# Patient Record
Sex: Female | Born: 1945 | Race: White | Hispanic: No
Health system: Southern US, Community
[De-identification: ages and names within clinical notes are randomized; demographics above are authoritative.]

---

## 2009-12-16 ENCOUNTER — Inpatient Hospital Stay: Payer: Self-pay | Admitting: Internal Medicine

## 2010-02-21 ENCOUNTER — Emergency Department: Payer: Self-pay | Admitting: Emergency Medicine

## 2010-04-22 ENCOUNTER — Ambulatory Visit: Payer: Self-pay

## 2010-09-21 ENCOUNTER — Inpatient Hospital Stay: Payer: Self-pay | Admitting: Internal Medicine

## 2010-11-26 ENCOUNTER — Emergency Department: Payer: Self-pay | Admitting: Unknown Physician Specialty

## 2014-01-09 ENCOUNTER — Observation Stay: Payer: Self-pay | Admitting: Internal Medicine

## 2014-01-09 LAB — COMPREHENSIVE METABOLIC PANEL
AST: 20 U/L (ref 15–37)
Albumin: 3.8 g/dL (ref 3.4–5.0)
Alkaline Phosphatase: 117 U/L
Anion Gap: 2 — ABNORMAL LOW (ref 7–16)
BUN: 7 mg/dL (ref 7–18)
Bilirubin,Total: 0.5 mg/dL (ref 0.2–1.0)
CALCIUM: 8.6 mg/dL (ref 8.5–10.1)
CHLORIDE: 106 mmol/L (ref 98–107)
Co2: 33 mmol/L — ABNORMAL HIGH (ref 21–32)
Creatinine: 0.87 mg/dL (ref 0.60–1.30)
Glucose: 94 mg/dL (ref 65–99)
OSMOLALITY: 279 (ref 275–301)
Potassium: 3.1 mmol/L — ABNORMAL LOW (ref 3.5–5.1)
SGPT (ALT): 18 U/L (ref 12–78)
Sodium: 141 mmol/L (ref 136–145)
Total Protein: 7.4 g/dL (ref 6.4–8.2)

## 2014-01-09 LAB — TROPONIN I
TROPONIN-I: 0.08 ng/mL — AB
Troponin-I: 0.09 ng/mL — ABNORMAL HIGH
Troponin-I: 0.08 ng/mL — ABNORMAL HIGH

## 2014-01-09 LAB — APTT: Activated PTT: 30.7 secs (ref 23.6–35.9)

## 2014-01-09 LAB — URINALYSIS, COMPLETE
BILIRUBIN, UR: NEGATIVE
Blood: NEGATIVE
GLUCOSE, UR: NEGATIVE mg/dL (ref 0–75)
Ketone: NEGATIVE
LEUKOCYTE ESTERASE: NEGATIVE
Nitrite: NEGATIVE
PH: 7 (ref 4.5–8.0)
Protein: NEGATIVE
RBC,UR: 1 /HPF (ref 0–5)
Specific Gravity: 1.005 (ref 1.003–1.030)
Squamous Epithelial: 1

## 2014-01-09 LAB — CBC
HCT: 39.6 % (ref 35.0–47.0)
HGB: 12.9 g/dL (ref 12.0–16.0)
MCH: 27.9 pg (ref 26.0–34.0)
MCHC: 32.6 g/dL (ref 32.0–36.0)
MCV: 86 fL (ref 80–100)
Platelet: 423 10*3/uL (ref 150–440)
RBC: 4.63 10*6/uL (ref 3.80–5.20)
RDW: 14.4 % (ref 11.5–14.5)
WBC: 11.8 10*3/uL — ABNORMAL HIGH (ref 3.6–11.0)

## 2014-01-09 LAB — LIPASE, BLOOD: LIPASE: 85 U/L (ref 73–393)

## 2014-01-09 LAB — PROTIME-INR
INR: 1
Prothrombin Time: 13.1 secs (ref 11.5–14.7)

## 2014-01-10 LAB — MAGNESIUM: Magnesium: 2.1 mg/dL

## 2014-01-10 LAB — POTASSIUM: Potassium: 3.8 mmol/L (ref 3.5–5.1)

## 2014-01-11 LAB — BASIC METABOLIC PANEL
Anion Gap: 7 (ref 7–16)
BUN: 13 mg/dL (ref 7–18)
CHLORIDE: 107 mmol/L (ref 98–107)
CREATININE: 1.08 mg/dL (ref 0.60–1.30)
Calcium, Total: 8.9 mg/dL (ref 8.5–10.1)
Co2: 29 mmol/L (ref 21–32)
EGFR (African American): >60
GFR CALC NON AF AMER: 54 — AB
GLUCOSE: 199 mg/dL — AB (ref 65–99)
OSMOLALITY: 291 (ref 275–301)
Potassium: 3.8 mmol/L (ref 3.5–5.1)
Sodium: 143 mmol/L (ref 136–145)

## 2014-01-11 LAB — HEMOGLOBIN: HGB: 12.5 g/dL (ref 12.0–16.0)

## 2014-01-15 LAB — PATHOLOGY REPORT

## 2014-01-18 ENCOUNTER — Emergency Department: Payer: Self-pay | Admitting: Internal Medicine

## 2014-01-18 LAB — URINALYSIS, COMPLETE
BACTERIA: NONE SEEN
Bilirubin,UR: NEGATIVE
Blood: NEGATIVE
Glucose,UR: NEGATIVE mg/dL (ref 0–75)
Ketone: NEGATIVE
Nitrite: NEGATIVE
Ph: 8 (ref 4.5–8.0)
Protein: NEGATIVE
Specific Gravity: 1.017 (ref 1.003–1.030)
WBC UR: 3 /HPF (ref 0–5)

## 2014-01-18 LAB — COMPREHENSIVE METABOLIC PANEL
ALBUMIN: 3.6 g/dL (ref 3.4–5.0)
Alkaline Phosphatase: 108 U/L
Anion Gap: 5 — ABNORMAL LOW (ref 7–16)
BILIRUBIN TOTAL: 0.6 mg/dL (ref 0.2–1.0)
BUN: 12 mg/dL (ref 7–18)
CHLORIDE: 106 mmol/L (ref 98–107)
Calcium, Total: 8.7 mg/dL (ref 8.5–10.1)
Co2: 30 mmol/L (ref 21–32)
Creatinine: 0.88 mg/dL (ref 0.60–1.30)
EGFR (African American): >60
EGFR (Non-African Amer.): >60
Glucose: 91 mg/dL (ref 65–99)
OSMOLALITY: 281 (ref 275–301)
POTASSIUM: 3.3 mmol/L — AB (ref 3.5–5.1)
SGOT(AST): 17 U/L (ref 15–37)
SGPT (ALT): 16 U/L (ref 12–78)
SODIUM: 141 mmol/L (ref 136–145)
TOTAL PROTEIN: 6.9 g/dL (ref 6.4–8.2)

## 2014-01-18 LAB — CBC
HCT: 40.1 % (ref 35.0–47.0)
HGB: 13.1 g/dL (ref 12.0–16.0)
MCH: 27.8 pg (ref 26.0–34.0)
MCHC: 32.6 g/dL (ref 32.0–36.0)
MCV: 86 fL (ref 80–100)
Platelet: 362 10*3/uL (ref 150–440)
RBC: 4.7 10*6/uL (ref 3.80–5.20)
RDW: 14.4 % (ref 11.5–14.5)
WBC: 9.7 10*3/uL (ref 3.6–11.0)

## 2014-01-18 LAB — TROPONIN I
TROPONIN-I: 0.08 ng/mL — AB
Troponin-I: 0.07 ng/mL — ABNORMAL HIGH

## 2014-03-01 ENCOUNTER — Emergency Department: Payer: Self-pay | Admitting: Emergency Medicine

## 2014-04-29 ENCOUNTER — Inpatient Hospital Stay: Payer: Self-pay | Admitting: Internal Medicine

## 2014-04-29 LAB — URINALYSIS, COMPLETE
BACTERIA: NONE SEEN
BILIRUBIN, UR: NEGATIVE
Blood: NEGATIVE
Glucose,UR: NEGATIVE mg/dL (ref 0–75)
Ketone: NEGATIVE
Nitrite: NEGATIVE
PROTEIN: NEGATIVE
Ph: 6 (ref 4.5–8.0)
Specific Gravity: 1.008 (ref 1.003–1.030)
Squamous Epithelial: 10
WBC UR: 14 /HPF (ref 0–5)

## 2014-04-29 LAB — COMPREHENSIVE METABOLIC PANEL
ALBUMIN: 3.5 g/dL (ref 3.4–5.0)
ALK PHOS: 114 U/L
AST: 15 U/L (ref 15–37)
Anion Gap: 8 (ref 7–16)
BUN: 5 mg/dL — ABNORMAL LOW (ref 7–18)
Bilirubin,Total: 0.4 mg/dL (ref 0.2–1.0)
CALCIUM: 8.7 mg/dL (ref 8.5–10.1)
CHLORIDE: 107 mmol/L (ref 98–107)
CREATININE: 0.73 mg/dL (ref 0.60–1.30)
Co2: 29 mmol/L (ref 21–32)
EGFR (African American): >60
EGFR (Non-African Amer.): >60
Glucose: 89 mg/dL (ref 65–99)
Osmolality: 284 (ref 275–301)
Potassium: 3.5 mmol/L (ref 3.5–5.1)
SGPT (ALT): 17 U/L
Sodium: 144 mmol/L (ref 136–145)
Total Protein: 6.9 g/dL (ref 6.4–8.2)

## 2014-04-29 LAB — CBC WITH DIFFERENTIAL/PLATELET
Basophil #: 0.1 10*3/uL (ref 0.0–0.1)
Basophil %: 1.1 %
EOS PCT: 1.7 %
Eosinophil #: 0.2 10*3/uL (ref 0.0–0.7)
HCT: 36.4 % (ref 35.0–47.0)
HGB: 11.9 g/dL — ABNORMAL LOW (ref 12.0–16.0)
Lymphocyte #: 2.9 10*3/uL (ref 1.0–3.6)
Lymphocyte %: 29.7 %
MCH: 26.8 pg (ref 26.0–34.0)
MCHC: 32.8 g/dL (ref 32.0–36.0)
MCV: 82 fL (ref 80–100)
MONOS PCT: 7.9 %
Monocyte #: 0.8 x10 3/mm (ref 0.2–0.9)
NEUTROS PCT: 59.6 %
Neutrophil #: 5.9 10*3/uL (ref 1.4–6.5)
Platelet: 477 10*3/uL — ABNORMAL HIGH (ref 150–440)
RBC: 4.45 10*6/uL (ref 3.80–5.20)
RDW: 15.5 % — AB (ref 11.5–14.5)
WBC: 9.9 10*3/uL (ref 3.6–11.0)

## 2014-04-29 LAB — CK TOTAL AND CKMB (NOT AT ARMC)
CK, TOTAL: 168 U/L
CK-MB: 2.3 ng/mL (ref 0.5–3.6)

## 2014-04-29 LAB — TROPONIN I: Troponin-I: 0.23 ng/mL — ABNORMAL HIGH

## 2014-04-29 LAB — APTT: Activated PTT: 29.9 secs (ref 23.6–35.9)

## 2014-04-29 LAB — HEPARIN LEVEL (UNFRACTIONATED)

## 2014-04-29 LAB — PROTIME-INR
INR: 1.1
Prothrombin Time: 13.6 secs (ref 11.5–14.7)

## 2014-04-30 LAB — CBC WITH DIFFERENTIAL/PLATELET
Basophil #: 0.1 10*3/uL (ref 0.0–0.1)
Basophil %: 1.2 %
EOS ABS: 0.2 10*3/uL (ref 0.0–0.7)
EOS PCT: 2.5 %
HCT: 33 % — ABNORMAL LOW (ref 35.0–47.0)
HGB: 10.7 g/dL — AB (ref 12.0–16.0)
Lymphocyte #: 3.3 10*3/uL (ref 1.0–3.6)
Lymphocyte %: 34.8 %
MCH: 26.6 pg (ref 26.0–34.0)
MCHC: 32.5 g/dL (ref 32.0–36.0)
MCV: 82 fL (ref 80–100)
MONO ABS: 0.8 x10 3/mm (ref 0.2–0.9)
MONOS PCT: 8.6 %
NEUTROS PCT: 52.9 %
Neutrophil #: 5 10*3/uL (ref 1.4–6.5)
Platelet: 419 10*3/uL (ref 150–440)
RBC: 4.04 10*6/uL (ref 3.80–5.20)
RDW: 15.6 % — ABNORMAL HIGH (ref 11.5–14.5)
WBC: 9.5 10*3/uL (ref 3.6–11.0)

## 2014-04-30 LAB — BASIC METABOLIC PANEL
Anion Gap: 6 — ABNORMAL LOW (ref 7–16)
BUN: 7 mg/dL (ref 7–18)
CHLORIDE: 109 mmol/L — AB (ref 98–107)
CREATININE: 1.17 mg/dL (ref 0.60–1.30)
Calcium, Total: 8.5 mg/dL (ref 8.5–10.1)
Co2: 28 mmol/L (ref 21–32)
EGFR (African American): 57 — ABNORMAL LOW
EGFR (Non-African Amer.): 49 — ABNORMAL LOW
GLUCOSE: 102 mg/dL — AB (ref 65–99)
Osmolality: 283 (ref 275–301)
Potassium: 3.5 mmol/L (ref 3.5–5.1)
SODIUM: 143 mmol/L (ref 136–145)

## 2014-04-30 LAB — HEPARIN LEVEL (UNFRACTIONATED)
ANTI-XA(UNFRACTIONATED): 0.25 [IU]/mL — AB (ref 0.30–0.70)
ANTI-XA(UNFRACTIONATED): 0.4 [IU]/mL (ref 0.30–0.70)

## 2014-04-30 LAB — TROPONIN I
Troponin-I: 0.22 ng/mL — ABNORMAL HIGH
Troponin-I: 0.19 ng/mL — ABNORMAL HIGH

## 2014-05-01 LAB — URINE CULTURE

## 2014-05-08 ENCOUNTER — Observation Stay: Payer: Self-pay | Admitting: Internal Medicine

## 2014-05-08 LAB — BASIC METABOLIC PANEL
Anion Gap: 8 (ref 7–16)
BUN: 9 mg/dL (ref 7–18)
CHLORIDE: 104 mmol/L (ref 98–107)
CO2: 26 mmol/L (ref 21–32)
CREATININE: 0.85 mg/dL (ref 0.60–1.30)
Calcium, Total: 8.6 mg/dL (ref 8.5–10.1)
EGFR (African American): >60
EGFR (Non-African Amer.): >60
Glucose: 93 mg/dL (ref 65–99)
OSMOLALITY: 274 (ref 275–301)
POTASSIUM: 3.1 mmol/L — AB (ref 3.5–5.1)
SODIUM: 138 mmol/L (ref 136–145)

## 2014-05-08 LAB — CBC
HCT: 37.3 % (ref 35.0–47.0)
HGB: 11.9 g/dL — ABNORMAL LOW (ref 12.0–16.0)
MCH: 26.1 pg (ref 26.0–34.0)
MCHC: 31.9 g/dL — ABNORMAL LOW (ref 32.0–36.0)
MCV: 82 fL (ref 80–100)
Platelet: 428 10*3/uL (ref 150–440)
RBC: 4.56 10*6/uL (ref 3.80–5.20)
RDW: 15.4 % — ABNORMAL HIGH (ref 11.5–14.5)
WBC: 9.9 10*3/uL (ref 3.6–11.0)

## 2014-05-08 LAB — TROPONIN I: Troponin-I: 0.15 ng/mL — ABNORMAL HIGH

## 2014-05-09 LAB — TROPONIN I
Troponin-I: 0.15 ng/mL — ABNORMAL HIGH
Troponin-I: 0.15 ng/mL — ABNORMAL HIGH

## 2014-05-09 LAB — CBC WITH DIFFERENTIAL/PLATELET
Basophil #: 0.1 10*3/uL (ref 0.0–0.1)
Basophil %: 1.5 %
EOS ABS: 0.3 10*3/uL (ref 0.0–0.7)
Eosinophil %: 2.6 %
HCT: 35.5 % (ref 35.0–47.0)
HGB: 11.6 g/dL — ABNORMAL LOW (ref 12.0–16.0)
Lymphocyte #: 3.5 10*3/uL (ref 1.0–3.6)
Lymphocyte %: 34.6 %
MCH: 26.7 pg (ref 26.0–34.0)
MCHC: 32.7 g/dL (ref 32.0–36.0)
MCV: 82 fL (ref 80–100)
MONO ABS: 0.8 x10 3/mm (ref 0.2–0.9)
Monocyte %: 8.1 %
Neutrophil #: 5.3 10*3/uL (ref 1.4–6.5)
Neutrophil %: 53.2 %
Platelet: 393 10*3/uL (ref 150–440)
RBC: 4.35 10*6/uL (ref 3.80–5.20)
RDW: 15.5 % — AB (ref 11.5–14.5)
WBC: 10 10*3/uL (ref 3.6–11.0)

## 2014-05-09 LAB — LIPID PANEL
CHOLESTEROL: 113 mg/dL (ref 0–200)
HDL Cholesterol: 38 mg/dL — ABNORMAL LOW (ref 40–60)
Ldl Cholesterol, Calc: 38 mg/dL (ref 0–100)
Triglycerides: 183 mg/dL (ref 0–200)
VLDL Cholesterol, Calc: 37 mg/dL (ref 5–40)

## 2014-05-09 LAB — BASIC METABOLIC PANEL
Anion Gap: 5 — ABNORMAL LOW (ref 7–16)
BUN: 11 mg/dL (ref 7–18)
CO2: 32 mmol/L (ref 21–32)
CREATININE: 1.09 mg/dL (ref 0.60–1.30)
Calcium, Total: 8.4 mg/dL — ABNORMAL LOW (ref 8.5–10.1)
Chloride: 105 mmol/L (ref 98–107)
EGFR (African American): >60
EGFR (Non-African Amer.): 53 — ABNORMAL LOW
GLUCOSE: 88 mg/dL (ref 65–99)
Osmolality: 282 (ref 275–301)
POTASSIUM: 3.5 mmol/L (ref 3.5–5.1)
SODIUM: 142 mmol/L (ref 136–145)

## 2014-07-06 ENCOUNTER — Inpatient Hospital Stay: Payer: Self-pay | Admitting: Internal Medicine

## 2014-07-06 LAB — HEPATIC FUNCTION PANEL A (ARMC)
ALT: 16 U/L
AST: 22 U/L (ref 15–37)
Albumin: 4 g/dL (ref 3.4–5.0)
Alkaline Phosphatase: 124 U/L — ABNORMAL HIGH
Bilirubin,Total: 0.3 mg/dL (ref 0.2–1.0)
Total Protein: 7.7 g/dL (ref 6.4–8.2)

## 2014-07-06 LAB — URINALYSIS, COMPLETE
BILIRUBIN, UR: NEGATIVE
Bacteria: NONE SEEN
Blood: NEGATIVE
GLUCOSE, UR: NEGATIVE mg/dL (ref 0–75)
Ketone: NEGATIVE
Leukocyte Esterase: NEGATIVE
Nitrite: NEGATIVE
Ph: 7 (ref 4.5–8.0)
Protein: NEGATIVE
Specific Gravity: 1.004 (ref 1.003–1.030)
Squamous Epithelial: 1
WBC UR: 3 /HPF (ref 0–5)

## 2014-07-06 LAB — CBC WITH DIFFERENTIAL/PLATELET
BASOS ABS: 0.1 10*3/uL (ref 0.0–0.1)
Basophil %: 1.2 %
EOS ABS: 0.1 10*3/uL (ref 0.0–0.7)
Eosinophil %: 1.1 %
HCT: 42 % (ref 35.0–47.0)
HGB: 13.4 g/dL (ref 12.0–16.0)
LYMPHS ABS: 1.8 10*3/uL (ref 1.0–3.6)
Lymphocyte %: 18.3 %
MCH: 25.4 pg — AB (ref 26.0–34.0)
MCHC: 31.8 g/dL — ABNORMAL LOW (ref 32.0–36.0)
MCV: 80 fL (ref 80–100)
Monocyte #: 0.5 x10 3/mm (ref 0.2–0.9)
Monocyte %: 4.8 %
NEUTROS ABS: 7.3 10*3/uL — AB (ref 1.4–6.5)
Neutrophil %: 74.6 %
Platelet: 495 10*3/uL — ABNORMAL HIGH (ref 150–440)
RBC: 5.26 10*6/uL — ABNORMAL HIGH (ref 3.80–5.20)
RDW: 15.8 % — ABNORMAL HIGH (ref 11.5–14.5)
WBC: 9.9 10*3/uL (ref 3.6–11.0)

## 2014-07-06 LAB — CK TOTAL AND CKMB (NOT AT ARMC)
CK, TOTAL: 329 U/L — AB
CK, TOTAL: 305 U/L — AB
CK, Total: 398 U/L — ABNORMAL HIGH
CK-MB: 4 ng/mL — ABNORMAL HIGH (ref 0.5–3.6)
CK-MB: 3.8 ng/mL — AB (ref 0.5–3.6)
CK-MB: 4.3 ng/mL — ABNORMAL HIGH (ref 0.5–3.6)

## 2014-07-06 LAB — BASIC METABOLIC PANEL
Anion Gap: 5 — ABNORMAL LOW (ref 7–16)
BUN: 5 mg/dL — ABNORMAL LOW (ref 7–18)
CALCIUM: 8.7 mg/dL (ref 8.5–10.1)
CO2: 33 mmol/L — AB (ref 21–32)
CREATININE: 0.88 mg/dL (ref 0.60–1.30)
Chloride: 103 mmol/L (ref 98–107)
GLUCOSE: 119 mg/dL — AB (ref 65–99)
Osmolality: 280 (ref 275–301)
POTASSIUM: 3 mmol/L — AB (ref 3.5–5.1)
SODIUM: 141 mmol/L (ref 136–145)

## 2014-07-06 LAB — TROPONIN I
TROPONIN-I: 0.22 ng/mL — AB
TROPONIN-I: 0.21 ng/mL — AB
Troponin-I: 0.2 ng/mL — ABNORMAL HIGH

## 2014-07-07 LAB — CBC WITH DIFFERENTIAL/PLATELET
BASOS ABS: 0.1 10*3/uL (ref 0.0–0.1)
Basophil %: 1.1 %
EOS ABS: 0.3 10*3/uL (ref 0.0–0.7)
Eosinophil %: 3.3 %
HCT: 37.8 % (ref 35.0–47.0)
HGB: 11.9 g/dL — ABNORMAL LOW (ref 12.0–16.0)
LYMPHS ABS: 3.1 10*3/uL (ref 1.0–3.6)
Lymphocyte %: 33.7 %
MCH: 25.4 pg — ABNORMAL LOW (ref 26.0–34.0)
MCHC: 31.4 g/dL — ABNORMAL LOW (ref 32.0–36.0)
MCV: 81 fL (ref 80–100)
MONO ABS: 0.8 x10 3/mm (ref 0.2–0.9)
Monocyte %: 8.7 %
NEUTROS ABS: 4.9 10*3/uL (ref 1.4–6.5)
NEUTROS PCT: 53.2 %
PLATELETS: 451 10*3/uL — AB (ref 150–440)
RBC: 4.66 10*6/uL (ref 3.80–5.20)
RDW: 16.2 % — ABNORMAL HIGH (ref 11.5–14.5)
WBC: 9.1 10*3/uL (ref 3.6–11.0)

## 2014-07-07 LAB — BASIC METABOLIC PANEL
ANION GAP: 4 — AB (ref 7–16)
BUN: 10 mg/dL (ref 7–18)
Calcium, Total: 8.6 mg/dL (ref 8.5–10.1)
Chloride: 103 mmol/L (ref 98–107)
Co2: 35 mmol/L — ABNORMAL HIGH (ref 21–32)
Creatinine: 0.88 mg/dL (ref 0.60–1.30)
EGFR (African American): >60
GLUCOSE: 80 mg/dL (ref 65–99)
Osmolality: 281 (ref 275–301)
Potassium: 3.2 mmol/L — ABNORMAL LOW (ref 3.5–5.1)
Sodium: 142 mmol/L (ref 136–145)

## 2014-07-07 LAB — LIPID PANEL
Cholesterol: 142 mg/dL (ref 0–200)
HDL Cholesterol: 47 mg/dL (ref 40–60)
LDL CHOLESTEROL, CALC: 55 mg/dL (ref 0–100)
TRIGLYCERIDES: 198 mg/dL (ref 0–200)
VLDL CHOLESTEROL, CALC: 40 mg/dL (ref 5–40)

## 2014-07-07 LAB — MAGNESIUM: Magnesium: 2.4 mg/dL

## 2014-07-08 ENCOUNTER — Ambulatory Visit: Payer: Self-pay | Admitting: Neurology

## 2014-09-10 ENCOUNTER — Emergency Department: Payer: Self-pay | Admitting: Emergency Medicine

## 2014-09-10 LAB — CBC WITH DIFFERENTIAL/PLATELET
BASOS ABS: 0.1 10*3/uL (ref 0.0–0.1)
BASOS PCT: 0.8 %
EOS PCT: 4.9 %
Eosinophil #: 0.5 10*3/uL (ref 0.0–0.7)
HCT: 39.9 % (ref 35.0–47.0)
HGB: 12.5 g/dL (ref 12.0–16.0)
LYMPHS PCT: 20.4 %
Lymphocyte #: 2.1 10*3/uL (ref 1.0–3.6)
MCH: 25.8 pg — ABNORMAL LOW (ref 26.0–34.0)
MCHC: 31.4 g/dL — ABNORMAL LOW (ref 32.0–36.0)
MCV: 82 fL (ref 80–100)
MONO ABS: 0.5 x10 3/mm (ref 0.2–0.9)
MONOS PCT: 5.1 %
NEUTROS PCT: 68.8 %
Neutrophil #: 7 10*3/uL — ABNORMAL HIGH (ref 1.4–6.5)
PLATELETS: 426 10*3/uL (ref 150–440)
RBC: 4.86 10*6/uL (ref 3.80–5.20)
RDW: 16.6 % — AB (ref 11.5–14.5)
WBC: 10.2 10*3/uL (ref 3.6–11.0)

## 2014-09-10 LAB — URINALYSIS, COMPLETE
Bilirubin,UR: NEGATIVE
Blood: NEGATIVE
GLUCOSE, UR: NEGATIVE mg/dL (ref 0–75)
Leukocyte Esterase: NEGATIVE
NITRITE: NEGATIVE
Ph: 6 (ref 4.5–8.0)
Protein: 30
RBC,UR: 1 /HPF (ref 0–5)
Specific Gravity: 1.024 (ref 1.003–1.030)

## 2014-09-10 LAB — COMPREHENSIVE METABOLIC PANEL
ALT: 15 U/L
ANION GAP: 8 (ref 7–16)
Albumin: 3.8 g/dL (ref 3.4–5.0)
Alkaline Phosphatase: 119 U/L — ABNORMAL HIGH
BUN: 10 mg/dL (ref 7–18)
Bilirubin,Total: 0.5 mg/dL (ref 0.2–1.0)
CALCIUM: 9.2 mg/dL (ref 8.5–10.1)
CO2: 32 mmol/L (ref 21–32)
CREATININE: 0.87 mg/dL (ref 0.60–1.30)
Chloride: 101 mmol/L (ref 98–107)
EGFR (African American): >60
GLUCOSE: 114 mg/dL — AB (ref 65–99)
Osmolality: 281 (ref 275–301)
POTASSIUM: 3.7 mmol/L (ref 3.5–5.1)
SGOT(AST): 18 U/L (ref 15–37)
SODIUM: 141 mmol/L (ref 136–145)
Total Protein: 7.3 g/dL (ref 6.4–8.2)

## 2014-09-10 LAB — TROPONIN I: Troponin-I: 0.11 ng/mL — ABNORMAL HIGH

## 2014-09-23 ENCOUNTER — Emergency Department: Payer: Self-pay | Admitting: Emergency Medicine

## 2014-09-23 LAB — TROPONIN I: TROPONIN-I: 0.11 ng/mL — AB

## 2014-09-23 LAB — CBC
HCT: 37.7 % (ref 35.0–47.0)
HGB: 12 g/dL (ref 12.0–16.0)
MCH: 25.8 pg — ABNORMAL LOW (ref 26.0–34.0)
MCHC: 31.8 g/dL — ABNORMAL LOW (ref 32.0–36.0)
MCV: 81 fL (ref 80–100)
PLATELETS: 380 10*3/uL (ref 150–440)
RBC: 4.64 10*6/uL (ref 3.80–5.20)
RDW: 15.9 % — ABNORMAL HIGH (ref 11.5–14.5)
WBC: 12.8 10*3/uL — AB (ref 3.6–11.0)

## 2014-09-23 LAB — BASIC METABOLIC PANEL
Anion Gap: 8 (ref 7–16)
BUN: 8 mg/dL (ref 7–18)
CALCIUM: 8.4 mg/dL — AB (ref 8.5–10.1)
CHLORIDE: 105 mmol/L (ref 98–107)
CO2: 30 mmol/L (ref 21–32)
Creatinine: 0.95 mg/dL (ref 0.60–1.30)
EGFR (African American): >60
EGFR (Non-African Amer.): >60
Glucose: 118 mg/dL — ABNORMAL HIGH (ref 65–99)
Osmolality: 284 (ref 275–301)
POTASSIUM: 3.3 mmol/L — AB (ref 3.5–5.1)
Sodium: 143 mmol/L (ref 136–145)

## 2014-09-24 ENCOUNTER — Emergency Department: Payer: Self-pay | Admitting: Emergency Medicine

## 2014-12-06 ENCOUNTER — Emergency Department: Payer: Self-pay | Admitting: Emergency Medicine

## 2015-01-26 NOTE — Consult Note (Signed)
   Present Illness 69 [place] [facility] to the er wiht complaints of epigastric and upper abdominal discomfort. She also had mild midssternal chest pain. She has trivial troponin elevation to -.09. She has no prior cardiac history. SHe has a family history of cad. She is a remote smoker. She is currently pain free. She has improoved since admission.   Physical Exam:  GEN well nourished, no acute distress   HEENT PERRL, hearing intact to voice   NECK supple   RESP normal resp effort  clear BS  no use of accessory muscles   CARD Regular rate and rhythm  Normal, S1, S2  No murmur   ABD denies tenderness  normal BS   EXTR negative cyanosis/clubbing, negative edema   SKIN normal to palpation   NEURO cranial nerves intact, motor/sensory function intact   PSYCH A+O to time, place, person   Review of Systems:  Subjective/Chief Complaint abdominal and mid sternal chest pain   General: Fatigue   Skin: No Complaints   ENT: No Complaints   Eyes: No Complaints   Neck: No Complaints   Respiratory: No Complaints   Cardiovascular: Tightness   Gastrointestinal: Heartburn  Nausea   Genitourinary: No Complaints   Vascular: No Complaints   Musculoskeletal: No Complaints   Neurologic: No Complaints   Hematologic: No Complaints   Endocrine: No Complaints   Psychiatric: No Complaints   Review of Systems: All other systems were reviewed and found to be negative   Medications/Allergies Reviewed Medications/Allergies reviewed   EKG:  EKG NSR   Abnormal NSSTTW changes    Adhesive: Itching  Latex: Other   Impression 69 yo female with no prior cardiac history  who was admitte after presenting to the er wiht abdominal discomfort and mild chest pain. Has trivial troponin elevation. EKG is unremarkable. Pain is somewhat atypical for cad. Will rule out for an mi. Will review echo when available. Further workup pending course.    Plan 1. Rule out for mi 2. Echo 3. Will make furhter recs pending course.   Electronic Signatures: Dalia HeadingFath, Camillia Marcy A (MD)  (Signed 07-Apr-15 19:32)  Authored: General Aspect/Present Illness, History and Physical Exam, Review of System, EKG , Allergies, Impression/Plan   Last Updated: 07-Apr-15 19:32 by Dalia HeadingFath, Ifeoluwa Beller A (MD)

## 2015-01-26 NOTE — Consult Note (Signed)
Chief Complaint:  Subjective/Chief Complaint seen for abdominal pain, n.  Currently denies abdominal pain, minimal nausea. NPO for proceedure this pm. no bm.   VITAL SIGNS/ANCILLARY NOTES: **Vital Signs.:   09-Apr-15 11:25  Vital Signs Type Routine  Temperature Temperature (F) 97.8  Celsius 36.5  Temperature Source oral  Respirations Respirations 17  Systolic BP Systolic BP 122  Diastolic BP (mmHg) Diastolic BP (mmHg) 76  Mean BP 98  Pulse Ox % Pulse Ox % 97  Pulse Ox Activity Level  At rest  Oxygen Delivery 2L   Brief Assessment:  Cardiac Regular   Respiratory clear BS   Gastrointestinal details normal Soft  Nontender  Nondistended  No masses palpable  Bowel sounds normal   Lab Results: Routine Chem:  09-Apr-15 04:46   Glucose, Serum  199  BUN 13  Creatinine (comp) 1.08  Sodium, Serum 143  Potassium, Serum 3.8  Chloride, Serum 107  CO2, Serum 29  Calcium (Total), Serum 8.9  Anion Gap 7  Osmolality (calc) 291  eGFR (African American) >60  eGFR (Non-African American)  54 (eGFR values <18m/min/1.73 m2 may be an indication of chronic kidney disease (CKD). Calculated eGFR is useful in patients with stable renal function. The eGFR calculation will not be reliable in acutely ill patients when serum creatinine is changing rapidly. It is not useful in  patients on dialysis. The eGFR calculation may not be applicable to patients at the low and high extremes of body sizes, pregnant women, and vegetarians.)  Routine Hem:  07-Apr-15 11:19   Hemoglobin (CBC) 12.9  09-Apr-15 04:46   Hemoglobin (CBC) 12.5 (Result(s) reported on 11 Jan 2014 at 05:12AM.)   Assessment/Plan:  Assessment/Plan:  Assessment 1) luq abdominal pain, some llq abdominal  pain.  improved.  patient with h/o multiple nsaids daily previously seen black stools.   Plan 1) egd today.  evaluated by IM and Cardiology. cleared for proceedure.  I have discussed the risks benefits and complications of egd to  include not limited to bleeding infection perforation and sedation and she wishes to proceed.  further recs to follow.   Electronic Signatures: SLoistine Simas(MD)  (Signed 09-Apr-15 12:13)  Authored: Chief Complaint, VITAL SIGNS/ANCILLARY NOTES, Brief Assessment, Lab Results, Assessment/Plan   Last Updated: 09-Apr-15 12:13 by SLoistine Simas(MD)
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
## 2015-01-26 NOTE — Consult Note (Signed)
Brief Consult Note: Diagnosis: epigastric abdominal pain.   Patient was seen by consultant.   Consult note dictated.   Recommend further assessment or treatment.   Comments: Please see full Gi consult.  Patietn presenting with about a month of epigastric burning pain and occasional black stool.  Has been taking frequent full strength ASA before chang recent to IBP but to stomach upset from ASA.  Hemodynamically stable, not anemic.  Cardiology consult and plans for evaluation noted.  Recommend EGD when clinically feasible, continue ppi as you are.  Will await result of cardiology evaluation.  Following.  Electronic Signatures: Barnetta ChapelSkulskie, Martin (MD)  (Signed 07-Apr-15 20:17)  Authored: Brief Consult Note   Last Updated: 07-Apr-15 20:17 by Barnetta ChapelSkulskie, Martin (MD)
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
## 2015-01-26 NOTE — Consult Note (Signed)
   Present Illness Patient is a 69 year old female with no prior cardiac history but does have a history of COPD, hyperlipidemia and gastric ulcers.  She presented to the hospital with left-sided facial numbness.  She had no chest pain or shortness of breath.  CT scan of her brain did not show any acute stroke.  Her symptoms have improved somewhat.  She was noted to have a mild serum troponin elevation of 0.19.  She again had no chest pain or shortness of breath.  Electrocardiogram revealed no ischemia.  She is awaiting a  brain  MRI to further evaluate for possible CVA.  She does not appear to be ischemic at present and/or elevated troponin appears to be demand ischemia.  Consideration for outpatient functional study could be raised.   Physical Exam:  GEN no acute distress   HEENT PERRL, hearing intact to voice   NECK supple   RESP normal resp effort  clear BS   CARD Regular rate and rhythm  Normal, S1, S2  No murmur   ABD denies tenderness  normal BS   LYMPH negative neck, negative axillae   EXTR negative cyanosis/clubbing, negative edema   SKIN normal to palpation   NEURO cranial nerves intact, motor/sensory function intact, Mild non this per her report on her left side of her face and arms   PSYCH A+O to time, place, person   Review of Systems:  Subjective/Chief Complaint Left-sided numbness   General: No Complaints   Skin: No Complaints   ENT: No Complaints   Eyes: No Complaints   Neck: No Complaints   Respiratory: No Complaints   Cardiovascular: No Complaints   Gastrointestinal: No Complaints   Genitourinary: No Complaints   Vascular: No Complaints   Musculoskeletal: No Complaints   Neurologic: left-sided facial numbness   Hematologic: No Complaints   Endocrine: No Complaints   Psychiatric: No Complaints   Review of Systems: All other systems were reviewed and found to be negative   Medications/Allergies Reviewed Medications/Allergies reviewed    Family & Social History:  Family and Social History:  Family History Non-Contributory   EKG:  EKG NSR   Abnormal NSSTTW changes    Adhesive: Itching  Latex: Other   Impression 69 year old female admitted with left-sided facial numbness.  She had no chest pain or shortness of breath.  Electrocardiogram was unremarkable.  She had a mild serum troponin elevation which appears to be secondary to demand ischemia.  Her symptoms and the current complaints did not support an acute coronary event.  Would continue to workup her neurologic event would agree with a  brain MRI.  Should this be unremarkable would  ambulating consider discharge.  We discussed chronic anticoagulation with Neurology.  Patient is not require heparin or advanced anti-platelet therapy from a cardiac standpoint.  Consideration for outpatient functional study could be raised on stable neurologicaly   Plan 1. Continue current medications 2. He have brain MRI is unremarkable an acceptable from a neurologic standpoint would discontinue heparin as the patient does not require this from a cardiac standpoint 3. continue with enteric-coated aspirin 4. ambulate after MRI  and if neurologic status stabilizes with consideration for discharge in outpatient  follow-up for consideration for functional study   Electronic Signatures: Dalia HeadingFath, Kenneth A (MD)  (Signed 27-Jul-15 13:27)  Authored: General Aspect/Present Illness, History and Physical Exam, Review of System, Family & Social History, EKG , Allergies, Impression/Plan   Last Updated: 27-Jul-15 13:27 by Dalia HeadingFath, Kenneth A (MD)

## 2015-01-26 NOTE — Consult Note (Signed)
Chief Complaint:  Subjective/Chief Complaint seen for  abdominal pain.  no bm overnight, continues with mild luq discomfort, no n/v.   VITAL SIGNS/ANCILLARY NOTES: **Vital Signs.:   08-Apr-15 11:23  Vital Signs Type Routine  Temperature Temperature (F) 97.3  Celsius 36.2  Temperature Source oral  Pulse Pulse 58  Respirations Respirations 18  Systolic BP Systolic BP 116  Diastolic BP (mmHg) Diastolic BP (mmHg) 70  Mean BP 85  Pulse Ox % Pulse Ox % 92  Pulse Ox Activity Level  At rest  Oxygen Delivery 2L  *Intake and Output.:   Daily 08-Apr-15 07:00  Length of Stay Totals Intake:  700 Output:  300    Net:  400   Brief Assessment:  Cardiac Regular   Respiratory clear BS   Gastrointestinal details normal Soft  Nondistended  No masses palpable  Bowel sounds normal  No rebound tenderness  mild tenderness lower abd mostly llq.   Lab Results:  LabObservation:  08-Apr-15 09:49   OBSERVATION Reason for Test  Cardiology:  08-Apr-15 09:49   Echo Doppler REASON FOR EXAM:     COMMENTS:     PROCEDURE: ECH - ECHO DOPPLER COMPLETE(TRANSTHOR)  - Jan 10 2014  9:49AM   RESULT: Echocardiogram Report  Patient Name:   Lisa Donaldson Date of Exam: 01/10/2014 Medical Rec #:  [id number]      Custom1: Date of Birth:  26-Feb-1946   Height:       59.1 in Patient Age:    69 years    Weight:       136.5 lb Patient Gender: F           BSA:          1.57 m??  Indications: SOB Sonographer:    Cristela Blue RDCS Referring Phys: Hilda Lias, J  Summary:  1. Left ventricular ejection fraction, by visual estimation, is 70 to  75%.  2. Normal global left ventricular systolic function. 2D AND M-MODE MEASUREMENTS (normal ranges within parentheses): Left Ventricle:          Normal IVSd (2D):      0.82 cm (0.7-1.1) LVPWd (2D):     1.00 cm (0.7-1.1) Aorta/LA:                  Normal LVIDd (2D):     4.54 cm (3.4-5.7) Aortic Root (2D): 2.80 cm (2.4-3.7) LVIDs (2D):     2.72 cm           Left Atrium (2D):  3.70 cm (1.9-4.0) LV FS (2D):     40.1 %   (>25%) LV EF (2D):     70.9 %   (>50%)                                   Right Ventricle:                                   RVd (2D):        2.76 cm LV DIASTOLIC FUNCTION: MV Peak E: 0.45 m/s E/e' Ratio: 7.80 MV Peak A: 0.89 m/s Decel Time: 280 msec E/ARatio: 0.64 SPECTRAL DOPPLER ANALYSIS (where applicable): Mitral Valve: MV P1/2 Time: 81.20 msec MV Area, PHT: 2.71 cm?? Aortic Valve: AoV Max Vel: 0.95 m/s AoV Peak PG: 3.6 mmHg AoV Mean PG: LVOT Vmax: 0.71 m/s LVOT VTI:  LVOT Diameter: 2.20 cm AoV Area, Vmax: 2.84 cm?? AoV Area, VTI:  AoV Area, Vmn: Tricuspid Valve and PA/RV Systolic Pressure: TR Max Velocity: 1.56 m/s RA  Pressure: 5 mmHg RVSP/PASP: 14.7 mmHg Pulmonic Valve: PV Max Velocity: 0.95 m/s PV Max PG: 3.6 mmHg PV Mean PG:  PHYSICIAN INTERPRETATION: Left Ventricle: The left ventricular internal cavity size was normal. LV  septal wall thickness was normal. LV posterior wall thickness was normal.  No left ventricular hypertrophy. Global LV systolic function was normal. Left ventricular ejection fraction, by visual estimation, is 70 to 75%. Right Ventricle: The right ventricular size is normal. Global RV systolic  function is normal. Left Atrium: The left atrium is normal in size. Right Atrium: The right atrium is normal in size. Mitral Valve: The mitral valve is not well seen. Tricuspid Valve: The tricuspid valve is not well seen. The tricuspid  regurgitant velocity is 1.56 m/s, and with an assumed right atrial  pressure of 5 mmHg, the estimated rightventricular systolic pressure is   normal at 14.7 mmHg. Aortic Valve: The aortic valve was not well seen. The aortic valve is  tricuspid. The aortic valve is structurally normal, with no evidence of  sclerosis or stenosis.  [id number] Harold Hedge MD Electronically signed by [id number] Harold Hedge MD Signature Date/Time: 01/10/2014/12:34:35 PM  *** Final ***  IMPRESSION:  .    Verified By: Dalia Heading, M.D., MD  Routine Chem:  08-Apr-15 08:03   Potassium, Serum 3.8 (Result(s) reported on 10 Jan 2014 at 08:27AM.)  Magnesium, Serum 2.1 (1.8-2.4 THERAPEUTIC RANGE: 4-7 mg/dL TOXIC: > 10 mg/dL  -----------------------)   Radiology Results: XRay:    07-Apr-15 12:15, Abdomen 3 Way Includes PA Chest  Abdomen 3 Way Includes PA Chest   REASON FOR EXAM:    abd pain  COMMENTS:       PROCEDURE: DXR - DXR ABDOMEN 3-WAY (INCL PA CXR)  - Jan 09 2014 12:15PM     CLINICAL DATA:  Nausea and abdominal pain    EXAM:  ABDOMEN SERIES    COMPARISON:  Chest radiograph 11/26/2010    FINDINGS:  There is no evidence of free intraperitoneal air. Gas is seen  diffusely throughout nondilated small bowel loops in the colon.  There are no dilated bowel loops. No significant stool burden. The  stomach is nondilated. . No radiopaque calculi or other significant  radiographic abnormality is seen.    Heart size and mediastinal contours are within normal limits. Both  lungs are clear.     IMPRESSION:  Negative abdominal radiographs.  No acute cardiopulmonary disease.      Electronically Signed    By: Britta Mccreedy M.D.    On: 01/09/2014 12:24     Verified By: Oliver Hum, M.D.,  [place]:    07-Apr-15 14:25, US Abdomen Limited Survey  US Abdomen Limited Survey   REASON FOR EXAM:    severe RUQ and epigastric pain w/ nausea  COMMENTS:   Body Site: Gallbladder, Liver, Common Bile Duct    PROCEDURE: [place]  - US ABDOMEN LIMITED SURVEY  - Jan 09 2014  2:25PM     CLINICAL DATA:  Right upper quadrant pain with nausea    EXAM:  US ABDOMEN LIMITED - RIGHT UPPER QUADRANT    COMPARISON:  None.    FINDINGS:  Gallbladder:  No gallstones or wall thickening visualized. No sonographic Murphy  sign noted.    Common bile duct:    Diameter: 3.4 mm  Liver:    No focal lesion identified. Within normal limits in parenchymal  echogenicity. Septated cyst left lobe liver  measures 9 x 12 mm     IMPRESSION:  Negative  Electronically Signed    By: Marlan Palauharles  Clark M.D.    On: 01/09/2014 14:29         Verified By: Camelia PhenesAVID C. CLARK, M.D.,  Cardiology:    08-Apr-15 09:49, Echo Doppler  Echo Doppler   REASON FOR EXAM:      COMMENTS:       [facility]  - Jan 10 2014  9:49AM     RESULT: Echocardiogram Report    Patient Name:   Lisa Donaldson Date of Exam: 01/10/2014  Medical Rec #:  [id number]      Custom1:  Date of Birth:  08-26-1946   Height:       59.1 in  Patient Age:    69 years    Weight:       136.5 lb  Patient Gender: F           BSA:          1.57 m??    Indications: SOB  Sonographer:    Cristela BlueJerry Hege RDCS  Referring Phys: Hilda LiasSAINANI, VIVEK, J    Summary:   1. Left ventricular ejection fraction, by visual estimation, is 70 to   75%.   2. Normal global left ventricular systolic function.  2D AND M-MODE MEASUREMENTS (normal ranges within parentheses):  Left Ventricle:          Normal  IVSd (2D):      0.82 cm (0.7-1.1)  LVPWd (2D):     1.00 cm (0.7-1.1) Aorta/LA:                  Normal  LVIDd (2D):     4.54 cm (3.4-5.7) Aortic Root (2D): 2.80 cm (2.4-3.7)  LVIDs (2D):     2.72 cm           Left Atrium (2D): 3.70 cm (1.9-4.0)  LV FS (2D):     40.1 %   (>25%)  LV EF (2D):     70.9 %   (>50%)                                    Right Ventricle:                                    RVd (2D):        2.76 cm  LV DIASTOLIC FUNCTION:  MV Peak E: 0.57 m/s E/e' Ratio: 7.80  MV Peak A: 0.89 m/s Decel Time: 280 msec  E/ARatio: 0.64  SPECTRAL DOPPLER ANALYSIS (where applicable):  Mitral Valve:  MV P1/2 Time: 81.20 msec  MV Area, PHT: 2.71 cm??  Aortic Valve: AoV Max Vel: 0.95 m/s AoV Peak PG: 3.6 mmHg AoV Mean PG:  LVOT Vmax: 0.71 m/s LVOT VTI:  LVOT Diameter: 2.20 cm  AoV Area, Vmax: 2.84 cm?? AoV Area, VTI:  AoV Area, Vmn:  Tricuspid Valve and PA/RV Systolic Pressure: TR Max Velocity: 1.56 m/s RA   Pressure: 5 mmHg  RVSP/PASP: 14.7 mmHg  Pulmonic Valve:  PV Max Velocity: 0.95 m/s PV Max PG: 3.6 mmHg PV Mean PG:    PHYSICIAN INTERPRETATION:  Left Ventricle: The left ventricular  internal cavity size was normal. LV   septal wall thickness was normal. LV posterior wall thickness was normal.   No left ventricular hypertrophy. Global LV systolic function was normal.  Left ventricular ejection fraction, by visual estimation, is 70 to 75%.  Right Ventricle: The right ventricular size is normal. Global RV systolic   function is normal.  Left Atrium: The left atrium is normal in size.  Right Atrium: The right atrium is normal in size.  Mitral Valve: The mitral valve is not well seen.  Tricuspid Valve: The tricuspid valve is not well seen. The tricuspid   regurgitant velocity is 1.56 m/s, and with an assumed right atrial   pressure of 5 mmHg, the estimated rightventricular systolic pressure is     normal at 14.7 mmHg.  Aortic Valve: The aortic valve was not well seen. The aortic valve is   tricuspid. The aortic valve is structurally normal, with no evidence of   sclerosis or stenosis.    [id number] Harold Hedge MD  Electronically signed by [id number] Harold Hedge MD  Signature Date/Time: 01/10/2014/12:34:35 PM    *** Final ***    IMPRESSION: .        Verified By: Dalia Heading, M.D., MD   Assessment/Plan:  Assessment/Plan:  Assessment 1) luq/llq pain, recent black stools in the setting of asa and ibp use. hemodynamically stable no evidence of bleeding since admission.   2) cardiology evaluation neg so far, awaiting cardiology note.   Plan 1) planning egd for early tomorrow pm.  I have discussed the risks benefits and complications of egd to include not limited to bleeding infection perforation and sedation and she wishes to proceed. continue ppi.   Electronic Signatures: Barnetta Chapel (MD)  (Signed 08-Apr-15 16:34)  Authored: Chief Complaint, VITAL SIGNS/ANCILLARY NOTES, Brief Assessment, Lab Results,  Radiology Results, Assessment/Plan   Last Updated: 08-Apr-15 16:34 by Barnetta Chapel (MD)
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
## 2015-01-26 NOTE — Discharge Summary (Signed)
PATIENT NAME:  Lisa Donaldson, Lisa Donaldson MR#:  [id number] DATE OF BIRTH:  1946/01/29  DATE OF ADMISSION:  07/06/2014 DATE OF DISCHARGE:  07/09/2014  DISPOSITION: [facility].   DISCHARGE DIAGNOSES: 1.  Left facial numbness and left-sided weakness secondary to complicated migraine.  2.  Generalized weakness.  3.  Hypokalemia.  4.  Anxiety.  5.  Chronic respiratory failure due to chronic obstructive pulmonary disease. 6.  Nicotine abuse.  DISCHARGE MEDICATIONS: Atorvastatin 80 mg p.o. daily, Spiriva 18 mcg inhalation daily, pantoprazole 40 mg p.o. b.i.d., Advair Diskus 500/50 one puff b.i.d., BuSpar 10 mg 2 tablets p.o. t.i.d., clonazepam 1 mg p.o. b.i.d., promethazine 25 mg 1 tablet every 6 hours as needed for nausea and vomiting, Paxil 40 mg p.o. daily, naproxen 250 mg p.o. b.i.d., Albuterol nebulizers every 6 hours as needed for trouble breathing.   CONSULTATIONS: Neurology with Dr. Loretha BrasilZeylikman.  HOSPITAL COURSE:  1.  This patient is a 69 year old female patient who comes because of left facial numbness and left-sided weakness. The patient was seen in August for the same complaint and discharged to her home with aspirin 325 mg p.o. daily, however, the patient did not take aspirin saying that she has gastric ulcers and she was not taking aspirin. Then in between [facility] went [facility] and saw multiple neurologists for the same problem. The patient's work-up for stroke has been negative so she came here and was admitted. The patient complained of pain and headache. She has chronic headaches and some numbness in the left side of the face. The patient's stroke work-up including CT of head did show some right lacunar stroke, but MRI did not confirm that, so I called Dr. Loretha BrasilZeylikman who saw the patient. The patient thought to have an  anxiety attack and her symptoms are due to complicated migraine than actual CVA or TIA, so he did not recommend further imaging. He suggested to have migraine cocktail of magnesium and  vitamin B. The patient was given 1 gram of magnesium yesterday. The patient is seen by physical therapy. He recommended rehab. The patient's neurological exam was within normal limits. She chose [facility], so we have discharged her to [facility]. The patient advised to continue aspirin 81 mg daily. She did have an EGD in April by Dr. Marva PandaSkulskie, which showed erosive gastritis and gastric ulcer. At that time, Dr. Marva PandaSkulskie says she can continue PPIs and take baby aspirin. I told her the same. Advised her to continue baby aspirin and also continue PPIs.  2.  COPD. The patient does have some cough and also ongoing tobacco abuse. Advised to continue Advair and also Spiriva. She is not wheezing in this hospitalization.  3.  Anxiety. She is on Paxil and Klonopin. We will continue that.  4.  Hyperlipidemia. She is on atorvastatin 80 mg daily at home so we continued that.   DIAGNOSTIC DATA: MRA of the brain was done which showed no acute intracranial abnormalities.   LDL 55. WBC 9.1, hemoglobin 11.9, hematocrit 37.8, platelets 451,000. Kidney function stayed stable, except low potassium of 3.2. The patient's magnesium 2.4. The patient does have chronically elevated troponin of 0.20 in her work-up done before and EF was more than 55% on previous echo. The patient's other labs were normal.   EKG showed normal sinus rhythm at 73 beats per minute.   The patient's primary doctor is Dr. Dennison BullaVinay Reddy and she is advised to follow up with him, and she can follow up with neurology as needed.  TIME SPENT: More than 30 minutes.   ____________________________ Katha Hamming, MD sk:sb D: 07/09/2014 12:40:08 ET T: 07/09/2014 13:45:03 ET JOB#: [id number]  cc: Katha Hamming, MD, <Dictator> Katha Hamming MD ELECTRONICALLY SIGNED 07/28/2014 18:03
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
## 2015-01-26 NOTE — Discharge Summary (Signed)
PATIENT NAME:  Lisa Donaldson, Lisa Donaldson MR#:  [id number] DATE OF BIRTH:  [birth year]  DATE OF ADMISSION:  04/29/2014 DATE OF DISCHARGE:  04/30/2014  DISCHARGE DIAGNOSES: 1.  Numbness of her left face.  2.  Elevated troponin, not indicative of acute coronary syndrome.   PERTINENT LABORATORIES AT DISCHARGE: Sodium 143, potassium 3.5, chloride 109, bicarbonate 28, BUN 7, creatinine 1.17, glucose 118. Troponin max 0.23. Troponin on discharge 0.19. White blood cells 9.5, hemoglobin 11, hematocrit 33, platelets of 419,000.  MRI BRAIN negative for CVA  HOSPITAL COURSE: A 69 year old female who presented on July 26 with left-sided facial numbness. For further details please refer to H and P. 1. Numbness of her face. The patient underwent an MRI of the brain and this was negative for acute CVA. This could be from a cervical issue.  She is on an aspirin and statin. She had no focal deficits.  2. Elevated troponin due to demand ischemia, not acute coronary syndrome. Cardiology was consulted. The patient was initially placed on a heparin drip. She needs outpatient functional studies. 3. Urinary tract infection. The patient has been started on Rocephin and will be discharged with CIPRO 4. Hypertension, which is stable.  5. COPD,  stable. 6. History of depression, stable.   DISCHARGE MEDICATIONS:  1. Lisinopril 10 mg daily.  2. Catapres 0.2 mg b.i.d. 3. Atorvastatin 80 mg at h.s.  4. Aspirin 81 mg daily.  5. Buspirone 15 mg t.i.d.  6. Clonazepam 1 mg t.i.d. p.r.n.  7. Clonazepam 1 mg p.o. b.i.d. 8. Clonazepam 1 mg p.o. t.i.d. p.r.n.  9. Proventil 90 mcg inhalation 2 puffs 4 times a day p.r.n.  10. Advair Diskus 500/50 b.i.d.  11. Spiriva 18 mcg inhalation daily.  12. Baclofen 10 mg t.i.d.  13. Pantoprazole 40 mg daily.  14. Ciprofloxacin 250 mg p.o. q.12 hours x 6 days.   DISCHARGE DIET:  Low sodium diet.  DISCHARGE ACTIVITY: As tolerated.  DISCHARGE FOLLOWUP:  The patient will need to follow up [facility] her  [facility].  TIME SPENT: Approximately 35 minutes.  ____________________________ Lisa ContesSital P. Juliene PinaMody, MD spm:am D: 04/30/2014 13:10:00 ET T: 05/01/2014 00:40:21 ET JOB#: [id number]  cc: [facility] Lisa Veiga P. Juliene PinaMody, MD, <Dictator>   Lisa ContesSITAL P Melvenia Favela MD ELECTRONICALLY SIGNED 05/01/2014 12:44

## 2015-01-26 NOTE — Consult Note (Signed)
PATIENT NAME:  Lisa Donaldson, Lisa Donaldson MR#:  [id number] DATE OF BIRTH:  25-Jan-1946  DATE OF CONSULTATION:  01/09/2014  CONSULTING PHYSICIAN:  Christena Deem, MD  Patient of Dr. Hilda Lias  REASON FOR CONSULTATION: Abdominal pain.   HISTORY OF PRESENT ILLNESS: Lisa Donaldson is a pleasant, 69 year old Caucasian female who states that she has been having problems with her stomach burning in the epigastric region for about a month. She states she has tried taking Pepto-Bismol at home, without benefit. She states she has been nauseated, but no vomiting. There has been epigastric pain, without radiation. The pain seems to decrease after eating, and will occasionally awaken her at night. She states she has seen some black bowel movements; however, has also been taking Pepto-Bismol. She seems to believe that the black may have started before taking Pepto-Bismol, but is not completely sure. There is no previous history of peptic ulcer disease. She generally has a bowel movement daily. She has been having some heartburn, but no dysphagia. She has been hemodynamically stable. She was trace Hemoccult positive when she came in, and was evaluated in the Emergency Room. She has also been seen by Cardiology. She has been taking ibuprofen regularly, after having changed from taking aspirin products regularly due to stomach upset.   PAST MEDICAL HISTORY: History of COPD, with oxygen dependence. Hypertension. Anxiety. Hyperlipidemia. Gastroesophageal reflux. She also states that she has had a history of a CVA about 2 years ago that makes her memory difficult for her at times. Her daughter had already left. She has had a carotid endarterectomy. She has also had right shoulder surgery.   CURRENT OUTPATIENT MEDICATIONS INCLUDE:  Advair Diskus 500/50 twice a day.  Aspirin 81 mg once a day. Atorvastatin 80 mg once a day. Baclofen 10 mg t.i.d. Buspirone 15 mg t.i.d. Catapres 0.2 mg twice a day. Clonazepam 1 mg twice a day. Lisinopril  10 mg once a day. Proventil CFC 90 mcg inhalation 4 times a day p.r.n. Spiriva 18 mcg inhalation capsule once a day. She has not been taking a proton pump inhibitor.   ALLERGIES: ADHESIVE and LATEX.   PHYSICAL EXAMINATION: VITAL SIGNS: Temperature is 98.1, pulse 61, respirations 18, blood pressure 175/71, pulse ox 98%.  GENERAL: She is a 69 year old Caucasian female in no acute distress.  HEENT: Normocephalic, atraumatic.  EYES: Anicteric.  NOSE: Septum midline. No lesions.  OROPHARYNX: No lesions.  NECK: Supple. No JVD. No lymphadenopathy. No thyromegaly.  HEART: Regular rate and rhythm.  LUNGS: Clear.  ABDOMEN: Soft. There is some mild epigastric discomfort to palpation. Bowel sounds positive, normoactive. There is no apparent organomegaly or masses felt. There is no rebound.  ANORECTAL:  Exam deferred.  EXTREMITIES: No clubbing, cyanosis, or edema.  NEUROLOGICAL: Cranial nerves II through XII grossly intact. Muscle strength bilaterally equal and symmetric, 5/5. DTRs bilaterally equal and symmetric.   LABORATORIES INCLUDE THE FOLLOWING: On admission to the hospital, she had a glucose of 94, BUN 7, creatinine 0.87, sodium 141, potassium 3.1, chloride 106, bicarb 33, calcium 8.6, lipase 85. She has had 2 troponin I's so far, one elevated at 0.08, one elevated at 0.09. Her hepatic profile was normal. Her hemogram showed a white count of 11.8 on admission, with a hemoglobin and hematocrit of 12.9/39.6, respectively, and a platelet count of 423. Of note, again her BUN was only 7. Her urinalysis was normal. She had a cardiopulmonary lactic acid of 1.1. She has had a 3-way abdominal film for abdominal pain that was negative,  also showing no acute cardiopulmonary changes. She had an abdominal ultrasound for epigastric pain, which showed no focal lesions and a negative study.   ASSESSMENT: Epigastric pain, burning in nature, over a period of the past month. She has also seen some black stools in the  setting of multiple ibuprofen and aspirin use. She has no previous history of peptic ulcer disease. She has been having night awakenings. These would be consistent with gastric ulcer. Of note, she has had some cardiopulmonary symptoms, for which she has also undergone a cardiology consult.   RECOMMENDATIONS: 1. Recommend EGD when clinically feasible.  2. Continue proton pump inhibitor, as you are.  3. Prior to doing luminal evaluation via sedated procedure, will await results of further Cardiology evaluation.   I have discussed the risks, benefits, and complications of EGD to include but not limited to bleeding, infection, perforation, and the risk of sedation, and she wishes to proceed. Will follow.     ____________________________ Christena DeemMartin U. Skulskie, MD mus:mr D: 01/09/2014 20:14:00 ET T: 01/09/2014 20:47:35 ET JOB#: [id number]  cc: Christena DeemMartin U. Skulskie, MD, <Dictator> Christena DeemMARTIN U SKULSKIE MD ELECTRONICALLY SIGNED 02/01/2014 11:32

## 2015-01-26 NOTE — Discharge Summary (Signed)
PATIENT NAME:  Lisa Donaldson, Shenika MR#:  [id number] DATE OF BIRTH:  [birth year]  DATE OF ADMISSION:  01/09/2014 DATE OF DISCHARGE:  01/11/2014  DISCHARGE DIAGNOSES:  1. Abdominal pain and melena, likely due to multiple gastric ulcer along with erosive gastritis, likely due to nonsteroidal anti-inflammatory usage.  2. Elevated troponin likely due to supply/demand ischemia with normal echocardiogram.  3. Chronic obstructive pulmonary disease exacerbation, improving on steroids.   SECONDARY DIAGNOSES:  1. Chronic obstructive pulmonary disease, oxygen dependent.  2. Hypertension.  3. Gastroesophageal reflux disease.  4. Anxiety. 5. Hyperlipidemia.   CONSULTATIONS: GI, Dr. Barnetta ChapelMartin Skulskie.   PROCEDURES AND RADIOLOGY: EGD by Dr. Marva PandaSkulskie on April 9th showed multiple clean base gastric ulcers, erosive gastritis. Esophageal motility disorder. Irregular Z line. Possibly NSAID induced etiology. Normal duodenum.   Abdominal 3-way with PA chest on April 7th showed no acute pathology.   Abdominal ultrasound on  April 7th was negative.   A 2-D echocardiogram on April 8th was within normal limits.   Urinalysis on admission was negative.   HISTORY AND SHORT HOSPITAL COURSE: The patient is a [age] year old female with the above-mentioned medical problems who was admitted for abdominal pain. Please see Dr. Hilbert OdorSainani's dictated history and physical for further details. The patient also had borderline enzyme for which cardiology consultation was obtained with Dr. Lady GaryFath who recommended 2-D echo, which was obtained and was essentially within normal limits. His enzymes were thought to be due to supply/demand ischemia. No MI. GI consultation was obtained with Dr. Marva PandaSkulskie considering epigastric abdominal pain and possible melena. He recommended getting an EGD considering the patient's significant use of nonsteroidal anti-inflammatory medication. EGD was performed on April 9th with results dictated above showing multiple  gastric ulcers. The patient was started on PPI twice a day and was slowly started on liquid diet which she tolerated fine. She really wanted to go home and did not have much symptoms and was discharged home in stable condition on April 9th. On the date of discharge, her vital signs were as follows: temperature 97.8, heart rate 68 per minute, respirations 20 per minute, blood pressure 123/64  mmHg.  She was saturating 96% on room air.   PERTINENT PHYSICAL EXAMINATION ON THE DATE OF DISCHARGE:  CARDIOVASCULAR: S1, S2 normal. No murmurs, rubs or gallop.  LUNGS: Clear to auscultation bilaterally. No wheezing, rales, rhonchi, or crepitation.  ABDOMEN: Soft, benign. NEUROLOGIC: Nonfocal examination. All other physical examination remained at the baseline.   DISCHARGE MEDICATIONS:  1. Baclofen 10 mg p.o. 3 times a day.  2. Buspirone 15 mg p.o. 3 times a day.  3. Atorvastatin 80 mg p.o. at bedtime.  4. Proventil 2 puffs inhaled 4 times a day as needed.  5. Advair 500/50 one puff b.i.d.  6. Catapres 0.2 mg p.o. b.i.d.  7. Lisinopril 10 mg p.o. daily.  8. Spiriva once daily.  9. Protonix 40 mg p.o. b.i.d.  10. Prednisone 20 mg p.o. daily for today and 10 mg p.o. daily tomorrow.  11. The patient was instructed to stop aspirin, Advair along with clonazepam for time being. She can resume baby aspirin a week later if no further bleeding or melena.   DISCHARGE DIET:  She was instructed to gradually advance diet from clear liquid to full liquid over next 48 hours. If she tolerates, she can advance it to soft diet for another over another 2 days and then to regular diet if tolerated soft diet.   DISCHARGE ACTIVITY: As tolerated.   DISCHARGE INSTRUCTIONS AND  FOLLOWUP: The patient was instructed to follow up with her new primary care physician at [facility] in 1-2 weeks. She will need followup with Dr. Barnetta Chapel from [facility] in 4-6 weeks for repeat endoscopy, likely in 7 weeks. She  will need followup with Dr. Lady Gary as scheduled.   TOTAL TIME DISCHARGING THIS PATIENT: 55 minutes.    Again, she was instructed not to use aspirin or any kind of nonsteroidal anti-inflammatory medications.  She can resume her baby aspirin after 1 week.    ____________________________ Deaundra Kutzer S. Sherryll Burger, MD vss:dd/am D: 01/11/2014 23:34:00 ET T: 01/12/2014 01:28:35 ET JOB#: [id number]  cc: [facility] Iantha Fallen A. Lady Gary, MD Christena Deem, MD Dyna Figuereo S. Sherryll Burger, MD, <Dictator>   Ellamae Sia [facility] MD ELECTRONICALLY SIGNED 01/13/2014 0:13

## 2015-01-26 NOTE — H&P (Signed)
PATIENT NAME:  Lisa Donaldson, Lisa Donaldson MR#:  [id number] DATE OF BIRTH:  [birth year]  DATE OF ADMISSION:  01/09/2014  PRIMARY CARE PHYSICIAN: Does not have one.   CHIEF COMPLAINT: Abdominal pain.   HISTORY OF PRESENT ILLNESS: This is a 69 year old female who presents to the hospital with ongoing abdominal pain now for about 2 to 3 weeks. The patient just moved from [place] and therefore has not established a primary care physician in this area. She is supposed to be seeing a physician at [facility] coming up in the next week or so. She has been having vague abdominal pain now for about a few weeks. She thought it was related to her move. She also thought that it could be related to some indigestion and therefore took some Pepto-Bismol and Maalox although it did not alleviate her symptoms. She therefore came to the ER for further evaluation today. The patient underwent abdominal ultrasound and x-ray of the abdomen, which is also negative. Incidentally, she was noted to have an elevated troponin at 0.008. She also had some mild wheezing and shortness of breath and therefore hospitalist services were contacted for further treatment and evaluation. The patient denied any chest pain. She admits to nausea but no vomiting. No diaphoresis, no palpitations, no syncope and no other associated symptoms presently.   REVIEW OF SYSTEMS: CONSTITUTIONAL: No documented fever. No weight gain or weight loss.  EYES: No blurred or double vision.  ENT: No tinnitus. No postnasal drip. No redness of the oropharynx.  RESPIRATORY: Positive cough. Positive wheeze. No hemoptysis. Positive COPD. CARDIOVASCULAR: No chest pain. No orthopnea. No palpitations. No syncope.  GASTROINTESTINAL: Positive nausea. No vomiting. Positive abdominal pain. No melena or hematochezia.  GENITOURINARY: No dysuria or hematuria.  ENDOCRINE: No polyuria or nocturia. No heat or cold intolerance.  HEME: No anemia, bruising, or bleeding.  INTEGUMENT: No rashes. No  lesions. MUSCULOSKELETAL: No arthritis. No swelling. No gout.  NEUROLOGIC: No numbness or tingling. No ataxia. No seizure-type activity.  PSYCHIATRIC: No anxiety. No insomnia. No ADD.   PAST MEDICAL HISTORY: Consistent with COPD oxygen dependent, hypertension, GERD, anxiety, hyperlipidemia.   ALLERGIES: LATEX.  SOCIAL HISTORY: Used to be a smoker, quit a few years back. Does have a 40 pack-year smoking history. No alcohol abuse. No illicit drug abuse. Lives at home with her daughter.   FAMILY HISTORY: Mother and father are both deceased. Mother died from complications of a stroke. She had COPD. Father died from cancer, likely brain cancer.   CURRENT MEDICATIONS: Advair 500/50 one puff b.i.d., aspirin 81 mg daily, atorvastatin 80 mg at bedtime, baclofen 10 mg t.i.d., Buspirone 15 mg t.i.d., Catapres 0.2 mg b.i.d., Klonopin 1 mg b.i.d., lisinopril 10 mg daily, albuterol inhaler 2 puffs 4 times daily as needed, Spiriva 1 puff daily, Catapres 0.2 mg b.i.d., Klonopin 1 mg b.i.d., lisinopril 10 mg daily.   PHYSICAL EXAMINATION: VITAL SIGNS: Presently, temperature is 98.2, pulse 65, respirations 18, blood pressure 156/61, sats 97% on room air.  GENERAL: She is a pleasant-appearing female in no apparent distress.  HEAD, EYES, EARS, NOSE AND THROAT: Atraumatic, normocephalic. Extraocular muscles intact. Pupils are equal and reactive to light. Sclerae anicteric. No conjunctival injection. No pharyngeal erythema.  NECK: Supple. There is no jugular venous distention. No bruits. No lymphadenopathy. No thyromegaly.  HEART: Regular rate and rhythm. No murmurs, no rubs, no clicks.  LUNGS: She has some mild end expiratory wheezing. No rales. No rhonchi. Negative use of accessory muscles. No dullness to percussion.  ABDOMEN: Soft,  flat, tender in the right upper quadrant but no rebound, no rigidity. Good bowel sounds. No hepatosplenomegaly appreciated.  EXTREMITIES: No evidence of any cyanosis, clubbing, or  peripheral edema. Has +2 pedal and radial pulses bilaterally.  NEUROLOGIC: The patient is alert, awake, and oriented x3 with no focal motor or sensory deficits appreciated bilaterally.  SKIN: Moist and warm with no rashes appreciated.  LYMPHATIC: There is no cervical or axillary lymphadenopathy.   DIAGNOSTIC DATA: Laboratory: serum glucose 94, BUN 7, creatinine 0.8, sodium 141, potassium 3.1, chloride 106, bicarb 33. LFTs are within normal limits. Troponin 0.08. White cell count 11.8, hemoglobin 12.9, hematocrit 39.6, platelet count 423,000. INR is 1. Urinalysis within normal limits. Lactic acid 1.1.   The patient had abdominal 3-way done which showed no acute cardiopulmonary disease, negative abdominal radiographs. The patient also had an ultrasound of the abdomen done, limited, which showed no focal lesion, a liver cyst.  ASSESSMENT AND PLAN: This is a 69 year old female with history of chronic obstructive pulmonary disease, hypertension, gastroesophageal reflux disease, anxiety, and hyperlipidemia who presents to the hospital due to abdominal pain for weeks. Incidentally noted to have a mildly elevated troponin.  1.  Abdominal pain. The exact etiology of this is unclear. The patient apparently has had these symptoms for weeks. Her LFTs are normal. Lipase is normal. She had a negative abdominal ultrasound and x-ray. She continues to have persistent nausea. I am concerned whether this is related to peptic ulcer disease. I will start her on PPI b.i.d. for now. We will get a GI consult, follow her clinically.  2.  Elevated troponin. The patient had no acute EKG changes. She had no chest pain. This is an incidental finding. She does have risk factors given her tobacco abuse and hypertension; therefore, I will observe her on telemetry, follow serial cardiac markers, continue on aspirin and statin. We will get a cardiology consult. Will also check a 2-dimensional echocardiogram.  3.  Chronic obstructive  pulmonary disease, mild acute exacerbation. We will place on a prednisone taper, continue her Advair, Spiriva and p.r.n. DuoNebs.  4.  Anxiety. Continue Klonopin.  5.  Hyperlipidemia. Continue atorvastatin.  6.  Hypertension. The patient is presently hemodynamically stable. Continue with her clonidine and lisinopril.   CODE STATUS: The patient is a FULL code.  TIME SPENT: 50 minutes.  ____________________________ Rolly PancakeVivek J. Cherlynn KaiserSainani, MD vjs:sb D: 01/09/2014 15:55:42 ET T: 01/09/2014 16:21:50 ET JOB#: [id number]  cc: Rolly PancakeVivek J. Cherlynn KaiserSainani, MD, <Dictator> Houston SirenVIVEK J Maurice Fotheringham MD ELECTRONICALLY SIGNED 01/14/2014 18:48

## 2015-01-26 NOTE — Discharge Summary (Signed)
PATIENT NAME:  Lisa Donaldson, Lisa Donaldson MR#:  [id number] DATE OF BIRTH:  [birth year]  DATE OF ADMISSION:  05/08/2014 DATE OF DISCHARGE:  05/09/2014  PRIMARY CARE PHYSICIAN:  Janece CanterburyVinay C. Reddy, MD at [facility].  CHIEF COMPLAINT:  Left facial numbness.  ADMITTING DIAGNOSES:   1.  Transient ischemic attack.  2. Elevated troponin.   PRIMARY DISCHARGE DIAGNOSES:   1.  Transient ischemic attack.  2.  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  SECONDARY DISCHARGE DIAGNOSES: 1.  Hyperlipidemia continue statin.  2.  Anxiety. Continue clonazepam.  3. Chronic obstructive pulmonary disease with chronic respiratory failure. No exacerbation. Continue Spiriva, Advair and home oxygen 2 liters via nasal cannula continuous.   CONSULTATIONS:  Neurology is consulted, but Dr. Katrinka BlazingSmith has recommended to see the patient as an outpatient.   PROCEDURES: None.   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 According to the HPI, she had multiple admissions or ER [facility]. Last night, when she came in with left-sided facial numbness, she was also complaining of chest pain. The patient was admitted to the hospitalist service under observation status with a diagnosis of TIA and elevated troponin.   During the hospital course, the patient's left facial numbness improved, but still she has some residual left facial numbness. MRI of the brain, carotid Dopplers are negative. A 2D echocardiogram was done recently on 01/10/2014 with a left ventricular ejection fraction of 70% to 75%. Her LDL is at 38.  Total cholesterol is at 113. The patient denies any dysphagia or dysarthria. Denies any headache.  No new extremity weakness. As  her symptoms are weaning off and studies are being negative, after my discussion with neurology, Dr. Katrinka BlazingSmith, decision is made to discharge the patient home. The patient is recommended to follow up with Dr. Katrinka BlazingSmith as an outpatient in 1-2 days. I have scheduled outpatient appointment with Dr. Katrinka BlazingSmith as well. The patient has chronic left-sided weakness from previous stroke.  The patient's troponins were trended.  As the troponin was elevated, it was persistently at 0.15 x3 with no uptrend.  It looks like the patient has chronic troponin leakage, though she does not have any past history of coronary artery disease in the past. The patient denies any chest pain or shortness of breath today. The patient was admitted to continue taking aspirin and statin. Primary care physician can consider referring the patient to cardiology if needed.   Chronic history of COPD. No exacerbation.  Plan is to continue her inhalers and oxygen 2 via liters of nasal cannula, as she has chronic respiratory failure.  Other hospital course was uneventful.  CONDITION:  At time of discharge is stable.   DISPOSITION: Home with 2 liters of oxygen via nasal cannula.   DISCHARGE MEDICATIONS:  Aspirin is changed to 325 mg p.o. once daily.  The patient was reinforced being compliant with her medications.  Atorvastatin 80 mg once daily, Spiriva 18 mcg 1 capsule inhalation once daily, pantoprazole 40 mg 2 times a day, Advair 1 puff inhalation 2 times a day, BuSpar 10 mg 2 tablets p.o. 3 times a day, Klonopin 1 mg p.o. 2 times a day, promethazine 25 mg 1 tablet by mouth q. 6 hours  as needed for nausea and vomiting, pyridoxine 40 mg once daily.   DIET:  Low-fat, low-cholesterol.   FOLLOWUP:  Appointment with primary care physician in 1-2 days and  Dr. Katrinka Blazing, of neurology in 1-2 days.   ACTIVITY: As tolerated.  SIGNIFICANT LABS AND IMAGING STUDIES: The patient's Chem-8 is normal, except anion gap at 5. Troponin 0.15 x3. Previously, it was at  around 0.23. CBC is normal, LDL 38, VLDL 37, total cholesterol is 113, HDL 38.  A 2D echocardiogram was done in April 2015, which has revealed left ventricular ejection fraction of 70% to 75% studies: Chest x-ray, PA and lateral view, has chronic bronchitis at lung base. No evidence of infiltrate MRI of the brain without contrast has revealed chronic changes, but no areas of acute cerebral ischemia. No significant change from his recent priors. Carotid Dopplers, very minimal amount of left-sided atherosclerotic plaque, not resulting in hemodynamically significant stenosis. Normal sonographic evaluation of the right carotid system.   TOTAL TIME SPENT ON DISCHARGE:  45 minutes.    ____________________________ Ramonita Lab, MD ag:ds D: 05/09/2014 13:58:01 ET T: 05/09/2014 14:52:30 ET JOB#: [id number]  cc: Ramonita Lab, MD, <Dictator> Hemang K. Sherryll Burger, MD Janece Canterbury, MD  Ramonita Lab MD ELECTRONICALLY SIGNED 05/17/2014 8:14

## 2016-11-26 IMAGING — CR DG CHEST 2V
1 series · 2 of 2 positions shown · non-contrast
Comparison: July 06, 2014

CLINICAL DATA: Cough and difficulty breathing for 2 days

EXAM:
CHEST  2 VIEW

[Series 1: dxr chest pa (or ap) and lateral · 0.14mm/px · 2 of 2 slices shown]
[im 1/2]
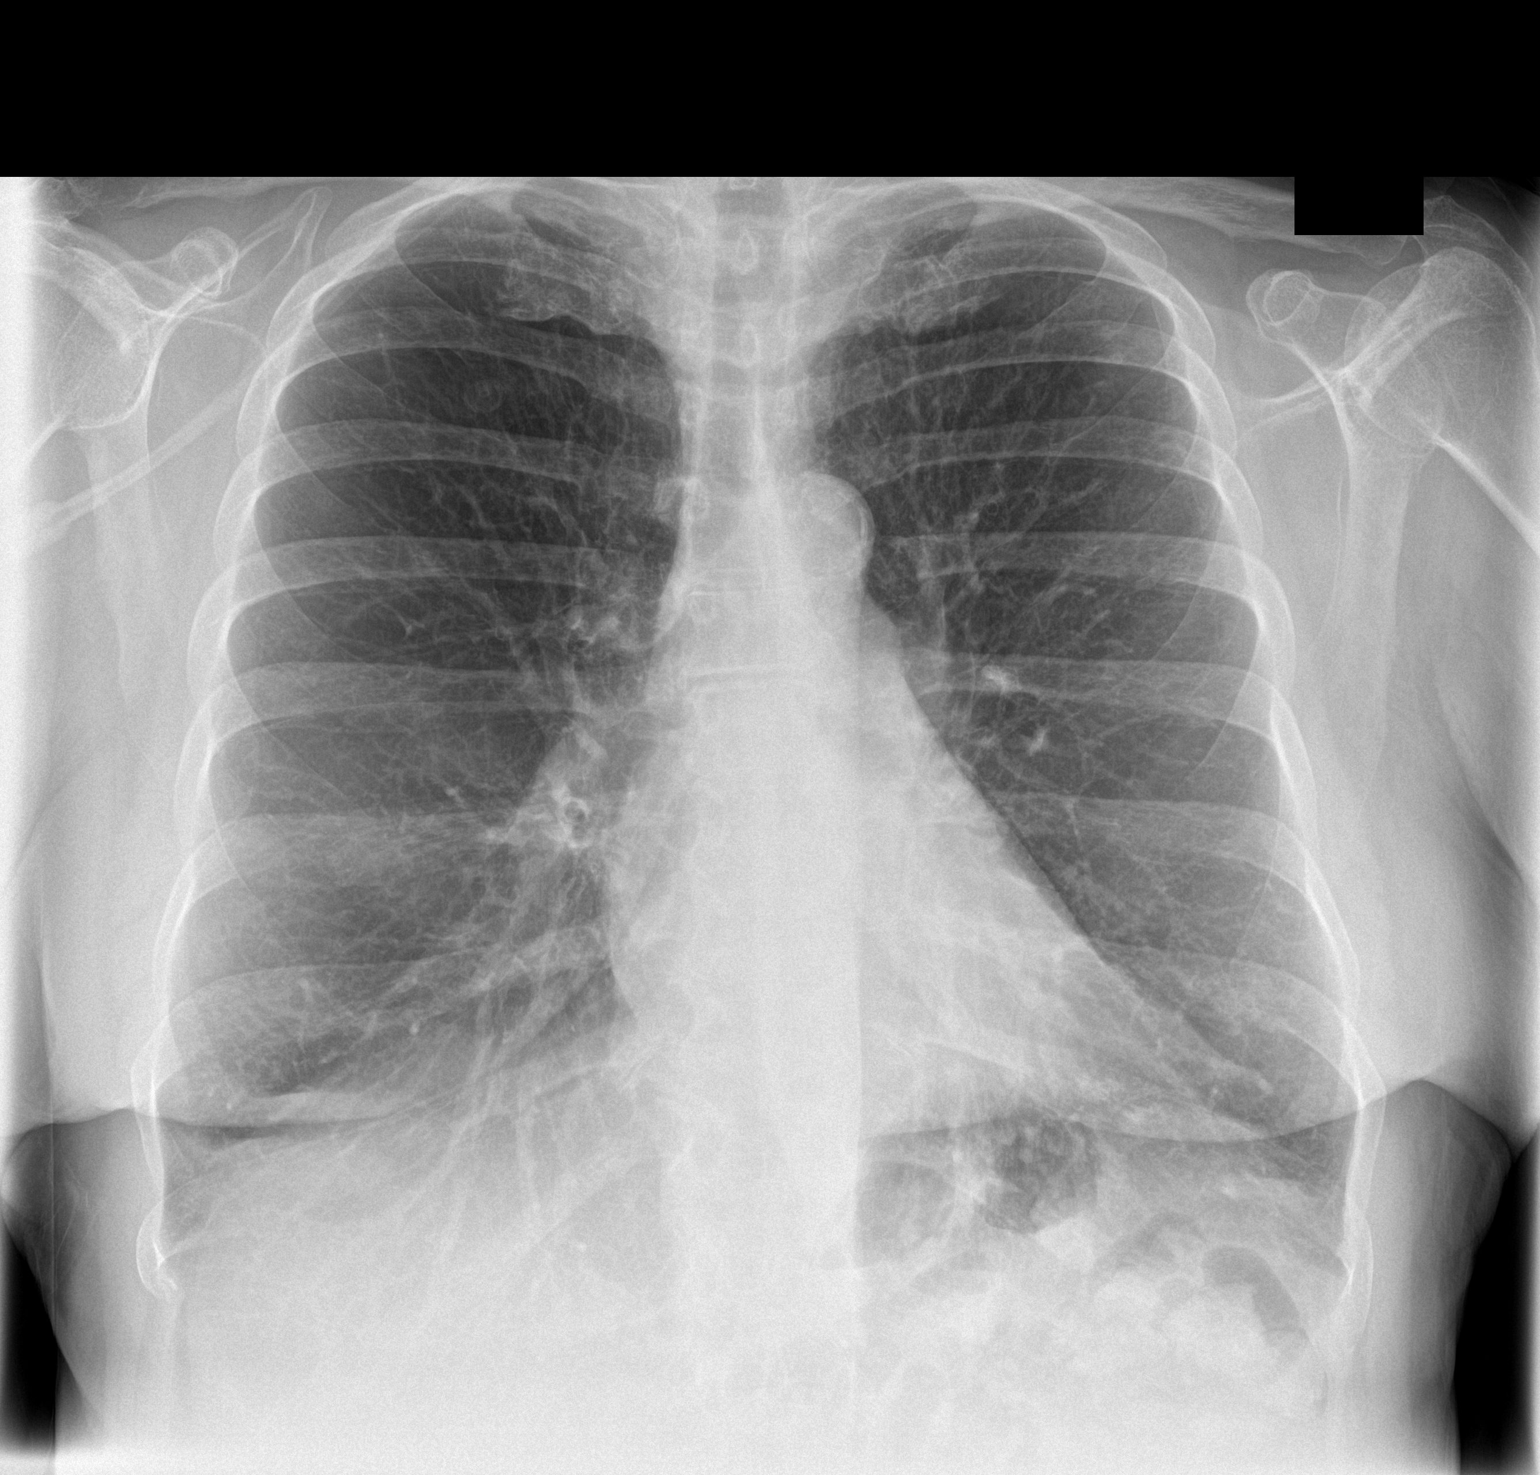
[im 2/2]
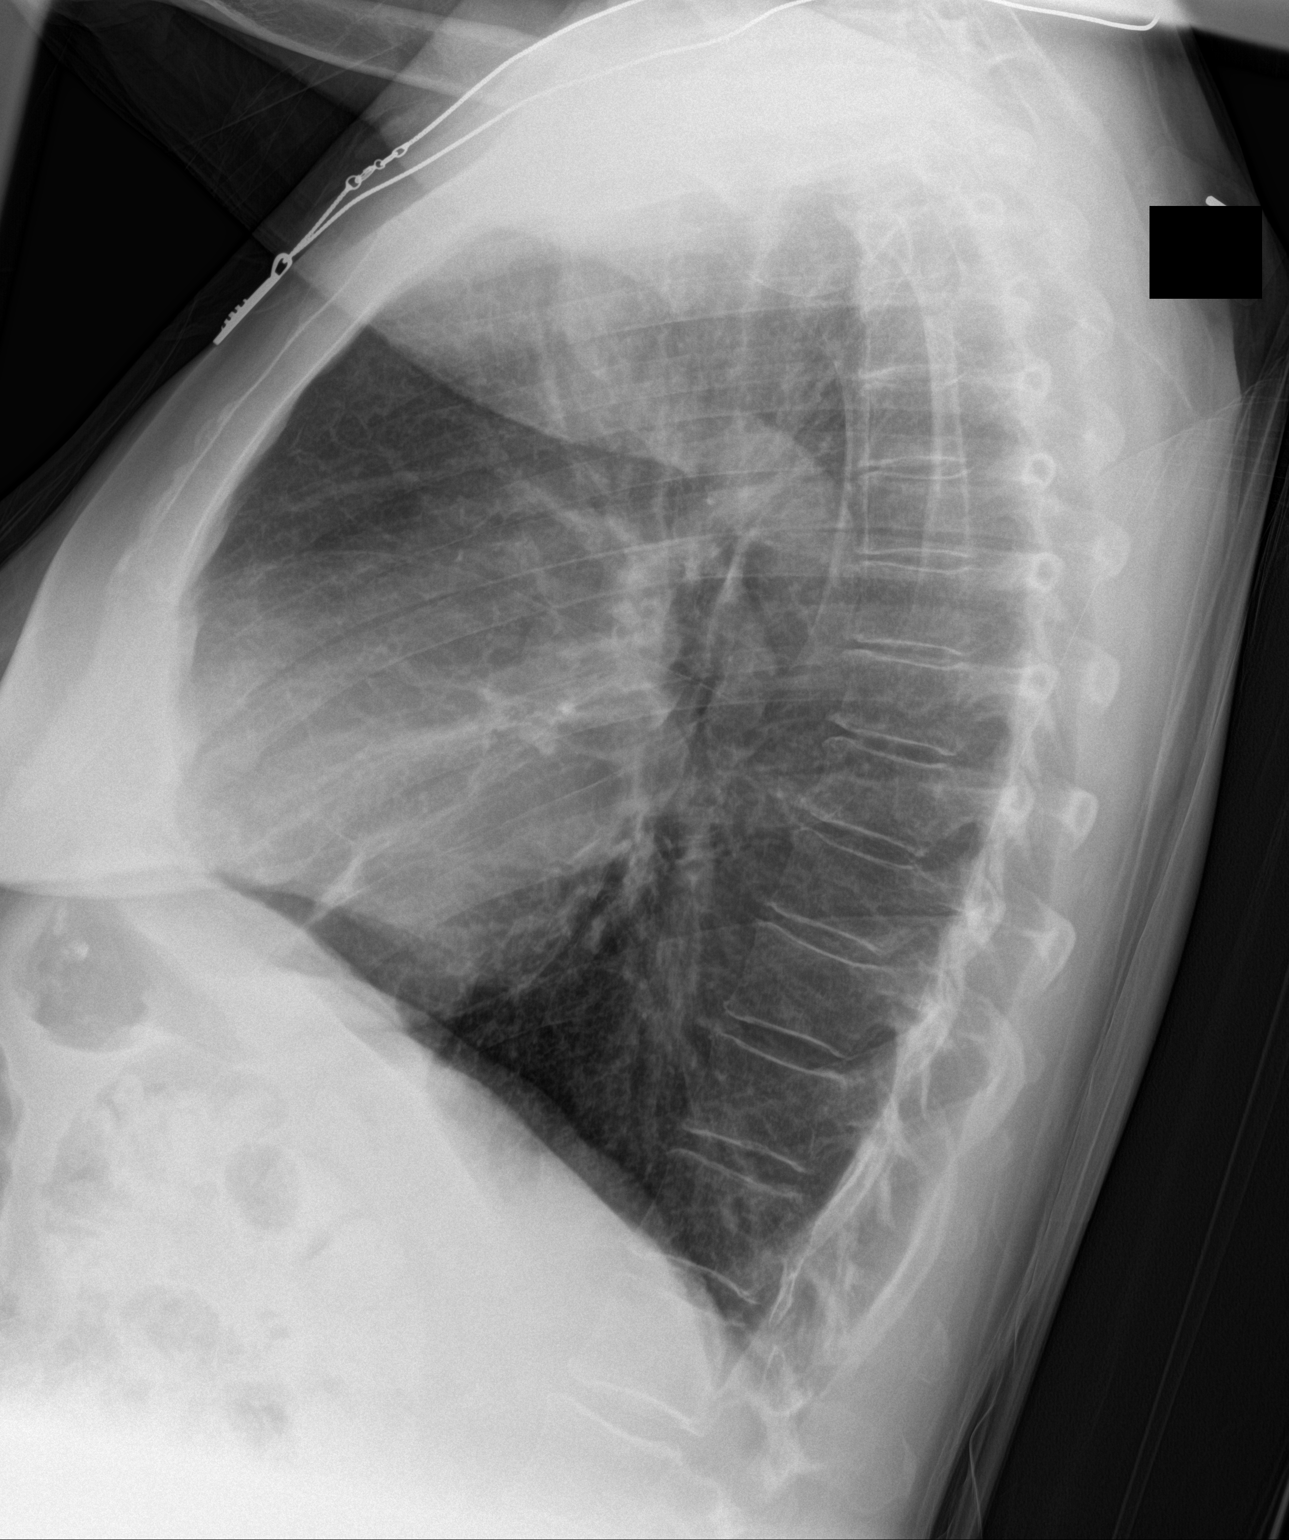

[2 of 2 positions shown; findings below may reference images not displayed]

FINDINGS: There is underlying emphysematous change. There is no edema or
consolidation. The heart size is normal. The pulmonary vascularity
reflects the underlying emphysematous change. No adenopathy. No bone
lesions.
IMPRESSION: Underlying emphysematous change.  No edema or consolidation.
